# Patient Record
Sex: Male | Born: 1955 | Race: White | Hispanic: No | Marital: Married | Smoking: Former smoker
Health system: Southern US, Community
[De-identification: ages and names within clinical notes are randomized; demographics above are authoritative.]

## PROBLEM LIST (undated history)

## (undated) DIAGNOSIS — I10 Essential (primary) hypertension: Secondary | ICD-10-CM

## (undated) HISTORY — PX: BACK SURGERY: SHX140

## (undated) HISTORY — PX: JOINT REPLACEMENT: SHX530

---

## 2013-12-30 ENCOUNTER — Encounter (HOSPITAL_BASED_OUTPATIENT_CLINIC_OR_DEPARTMENT_OTHER): Payer: Self-pay | Admitting: Emergency Medicine

## 2013-12-30 ENCOUNTER — Emergency Department (HOSPITAL_BASED_OUTPATIENT_CLINIC_OR_DEPARTMENT_OTHER)
Admission: EM | Admit: 2013-12-30 | Discharge: 2013-12-30 | Disposition: A | Discharge status: Home / Self Care | Payer: BC Managed Care – PPO | Attending: Emergency Medicine | Admitting: Emergency Medicine

## 2013-12-30 ENCOUNTER — Emergency Department (HOSPITAL_BASED_OUTPATIENT_CLINIC_OR_DEPARTMENT_OTHER): Payer: BC Managed Care – PPO

## 2013-12-30 DIAGNOSIS — Z79899 Other long term (current) drug therapy: Secondary | ICD-10-CM | POA: Insufficient documentation

## 2013-12-30 DIAGNOSIS — Z23 Encounter for immunization: Secondary | ICD-10-CM | POA: Insufficient documentation

## 2013-12-30 DIAGNOSIS — W230XXA Caught, crushed, jammed, or pinched between moving objects, initial encounter: Secondary | ICD-10-CM | POA: Insufficient documentation

## 2013-12-30 DIAGNOSIS — S61209A Unspecified open wound of unspecified finger without damage to nail, initial encounter: Secondary | ICD-10-CM | POA: Insufficient documentation

## 2013-12-30 DIAGNOSIS — Y9389 Activity, other specified: Secondary | ICD-10-CM | POA: Insufficient documentation

## 2013-12-30 DIAGNOSIS — Z87891 Personal history of nicotine dependence: Secondary | ICD-10-CM | POA: Insufficient documentation

## 2013-12-30 DIAGNOSIS — I10 Essential (primary) hypertension: Secondary | ICD-10-CM | POA: Insufficient documentation

## 2013-12-30 DIAGNOSIS — S61219A Laceration without foreign body of unspecified finger without damage to nail, initial encounter: Secondary | ICD-10-CM

## 2013-12-30 DIAGNOSIS — Y929 Unspecified place or not applicable: Secondary | ICD-10-CM | POA: Insufficient documentation

## 2013-12-30 HISTORY — DX: Essential (primary) hypertension: I10

## 2013-12-30 MED ORDER — SULFAMETHOXAZOLE-TRIMETHOPRIM 800-160 MG PO TABS: 1.0000 | ORAL_TABLET | Freq: Two times a day (BID) | ORAL | Status: AC

## 2013-12-30 MED ORDER — HYDROCODONE-ACETAMINOPHEN 5-325 MG PO TABS: 2.0000 | ORAL_TABLET | ORAL | Status: AC | PRN

## 2013-12-30 MED ORDER — TETANUS-DIPHTH-ACELL PERTUSSIS 5-2.5-18.5 LF-MCG/0.5 IM SUSP
0.5000 mL | Freq: Once | INTRAMUSCULAR | Status: AC
  Administered 2013-12-30: 0.5 mL via INTRAMUSCULAR
  Filled 2013-12-30: qty 0.5

## 2013-12-30 NOTE — ED Notes (Signed)
MD at bedside. 

## 2013-12-30 NOTE — ED Provider Notes (Signed)
CSN: [id number]     Arrival date & time 12/30/13  1923 History   First MD Initiated Contact with Patient 12/30/13 2011     Chief Complaint  Patient presents with  . Extremity Laceration     (Consider location/radiation/quality/duration/timing/severity/associated sxs/prior Treatment) Patient is a 58 y.o. male presenting with hand pain. The history is provided by the patient. No language interpreter was used.  Hand Pain This is a new problem. The current episode started today. The problem occurs constantly. The problem has been gradually worsening. Nothing aggravates the symptoms. He has tried nothing for the symptoms. The treatment provided no relief.  Pt pinched tip of finger with a golf club case.    Past Medical History  Diagnosis Date  . Hypertension    Past Surgical History  Procedure Laterality Date  . Back surgery    . Joint replacement     History reviewed. No pertinent family history. History  Substance Use Topics  . Smoking status: Former Games developermoker  . Smokeless tobacco: Not on file  . Alcohol Use: No    Review of Systems  Skin: Positive for wound.  All other systems reviewed and are negative.     Allergies  Review of patient's allergies indicates no known allergies.  Home Medications   Prior to Admission medications   Medication Sig Start Date End Date Taking? Authorizing Provider  losartan (COZAAR) 25 MG tablet Take 25 mg by mouth daily.   Yes Historical Provider, MD  metoprolol succinate (TOPROL-XL) 50 MG 24 hr tablet Take 50 mg by mouth daily. Take with or immediately following a meal.   Yes Historical Provider, MD  HYDROcodone-acetaminophen (NORCO/VICODIN) 5-325 MG per tablet Take 2 tablets by mouth every 4 (four) hours as needed. 12/30/13   Elson AreasLeslie K Macyn Shropshire, PA-C  sulfamethoxazole-trimethoprim (BACTRIM DS,SEPTRA DS) 800-160 MG per tablet Take 1 tablet by mouth 2 (two) times daily. 12/30/13 01/06/14  Elson AreasLeslie K Tessie Ordaz, PA-C   BP 168/84  Pulse 64  Temp(Src) 97.8 F  (36.6 C) (Oral)  Resp 18  Ht 6' (1.829 m)  Wt 187 lb (84.823 kg)  BMI 25.36 kg/m2  SpO2 100% Physical Exam  Vitals reviewed. Constitutional: He appears well-developed and well-nourished.  HENT:  Head: Normocephalic.  Cardiovascular: Normal rate.   Pulmonary/Chest: Effort normal.  Musculoskeletal: He exhibits tenderness.  Laceration distal tip of left ring finger,   Flap has color, seems to have blood flow,  Ns intact  Neurological: He is alert.  Skin: Skin is warm.    ED Course  LACERATION REPAIR Date/Time: 12/30/2013 9:16 PM Performed by: Elson AreasSOFIA, Evva Din K Authorized by: Elson AreasSOFIA, Marisol Glazer K Consent: Verbal consent obtained. Risks and benefits: risks, benefits and alternatives were discussed Consent given by: patient Required items: required blood products, implants, devices, and special equipment available Patient identity confirmed: verbally with patient Body area: upper extremity Location details: left ring finger Laceration length: 1 cm Tendon involvement: none Nerve involvement: none Vascular damage: no Anesthesia: digital block Local anesthetic: lidocaine 2% without epinephrine Preparation: Patient was prepped and draped in the usual sterile fashion. Irrigation solution: saline Amount of cleaning: standard Debridement: none Skin closure: 5-0 Prolene Number of sutures: 4 Technique: simple Approximation: close Approximation difficulty: simple Patient tolerance: Patient tolerated the procedure well with no immediate complications.   (including critical care time) Labs Review Labs Reviewed - No data to display  Imaging Review Dg Finger Ring Left  12/30/2013   CLINICAL DATA:  Status post injury of the distal aspect of the  fourth finger  EXAM: LEFT RING FINGER 2+V  COMPARISON:  None.  FINDINGS: There is disruption of the soft tissues over the tip of the finger. The underlying bone is intact. The interphalangeal joints are intact. No foreign bodies are demonstrated.   IMPRESSION: The patient has sustained soft tissue injury of the distal phalanx of the fourth finger. No acute fracture is demonstrated.   Electronically Signed   By: David  [place]   On: 12/30/2013 19:59     EKG Interpretation None      MDM   Final diagnoses:  Laceration of finger    Bactrim hydrocodone    Elson Areas, PA-C 12/30/13 2117  Lonia Skinner [place], PA-C 12/30/13 2220  Lonia Skinner [place], PA-C 12/30/13 2221  Lonia Skinner [place], PA-C 12/30/13 2222  Lonia Skinner [place], PA-C 12/30/13 2223

## 2013-12-30 NOTE — ED Notes (Signed)
Pt c/o left ring finger laceration a 1 hr ago by metal suitcase

## 2013-12-30 NOTE — ED Notes (Signed)
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

## 2013-12-30 NOTE — ED Provider Notes (Signed)
Medical screening examination/treatment/procedure(s) were performed by non-physician practitioner and as supervising physician I was immediately available for consultation/collaboration.   EKG Interpretation None        Everitt Wenner, MD 12/30/13 2330 

## 2013-12-30 NOTE — Discharge Instructions (Signed)
Fingertip Injuries and Amputations °Fingertip injuries are common and often get injured because they are last to escape when pulling your hand out of harm's way. You have amputated (cut off) part of your finger. How this turns out depends largely on how much was amputated. If just the tip is amputated, often the end of the finger will grow back and the finger may return to much the same as it was before the injury.  °If more of the finger is missing, your caregiver has done the best with the tissue remaining to allow you to keep as much finger as is possible. Your caregiver after checking your injury has tried to leave you with a painless fingertip that has durable, feeling skin. If possible, your caregiver has tried to maintain the finger's length and appearance and preserve its fingernail.  °Please read the instructions outlined below and refer to this sheet in the next few weeks. These instructions provide you with general information on caring for yourself. Your caregiver may also give you specific instructions. While your treatment has been done according to the most current medical practices available, unavoidable complications occasionally occur. If you have any problems or questions after discharge, please call your caregiver. °HOME CARE INSTRUCTIONS  °· You may resume normal diet and activities as directed or allowed. °· Keep your hand elevated above the level of your heart. This helps decrease pain and swelling. °· Keep ice packs (or a bag of ice wrapped in a towel) on the injured area for 15-20 minutes, 03-04 times per day, for the first two days. °· Change dressings if necessary or as directed. °· Clean the wound daily or as directed. °· Only take over-the-counter or prescription medicines for pain, discomfort, or fever as directed by your caregiver. °· Keep appointments as directed. °SEEK IMMEDIATE MEDICAL CARE IF: °· You develop redness, swelling, numbness or increasing pain in the wound. °· There is  pus coming from the wound. °· You develop an unexplained oral temperature above 102° F (38.9° C) or as your caregiver suggests. °· There is a foul (bad) smell coming from the wound or dressing. °· There is a breaking open of the wound (edges not staying together) after sutures or staples have been removed. °MAKE SURE YOU:  °· Understand these instructions. °· Will watch your condition. °· Will get help right away if you are not doing well or get worse. °Document Released: 06/06/2005 Document Revised: 10/08/2011 Document Reviewed: 05/05/2008 °[employer]® Patient Information ©2014 [employer]. ° °

## 2015-07-13 IMAGING — CR DG FINGER RING 2+V*L*
3 series · 3 of 3 positions shown · non-contrast
Comparison: None.

CLINICAL DATA: Status post injury of the distal aspect of the
fourth finger

EXAM:
LEFT RING FINGER 2+V

[x finger pa left]
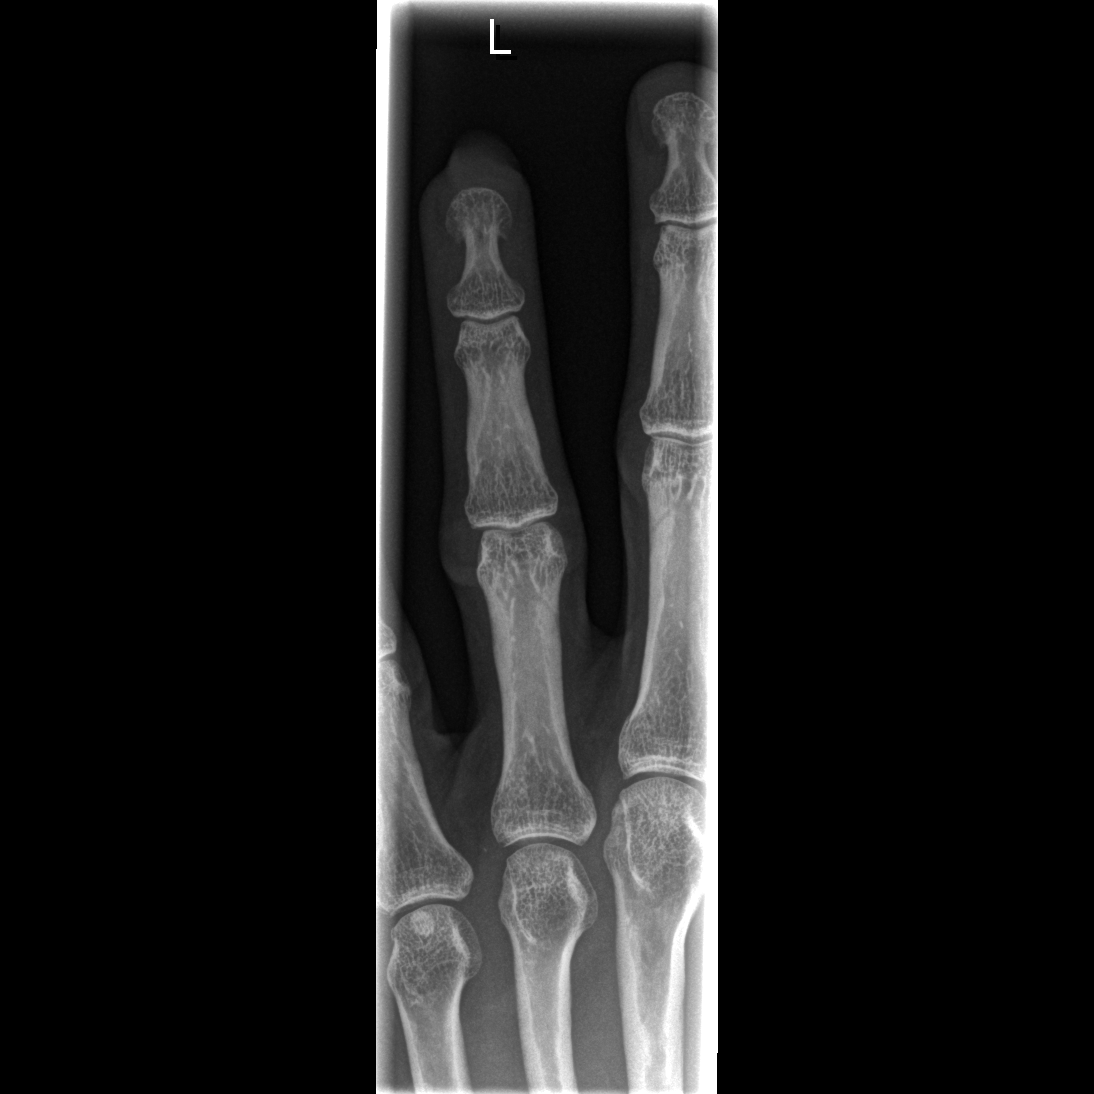

[x finger obl. left]
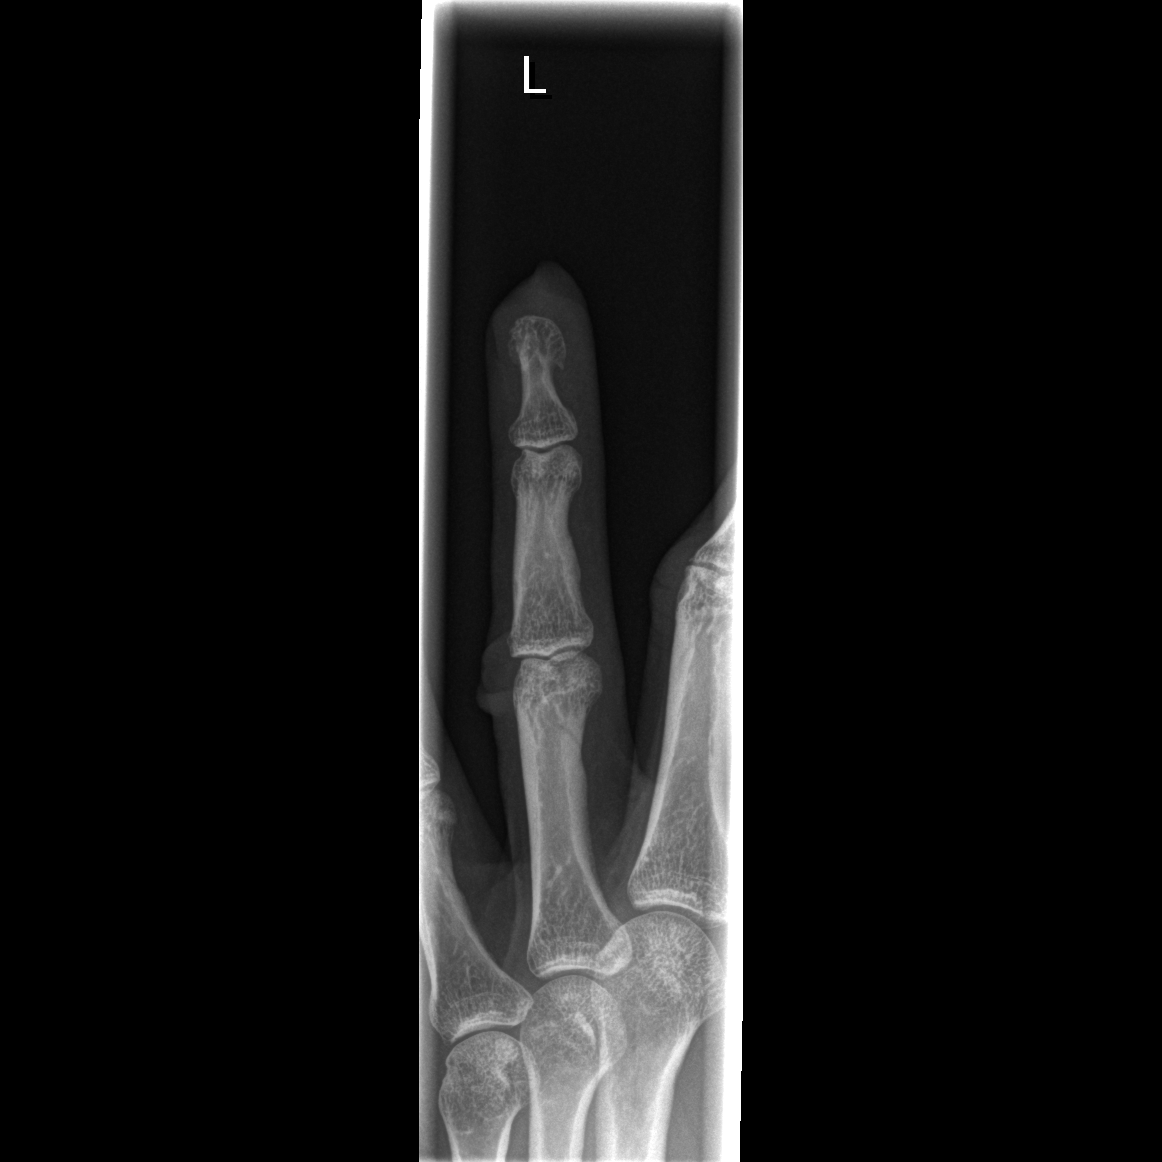

[x finger lateral left]
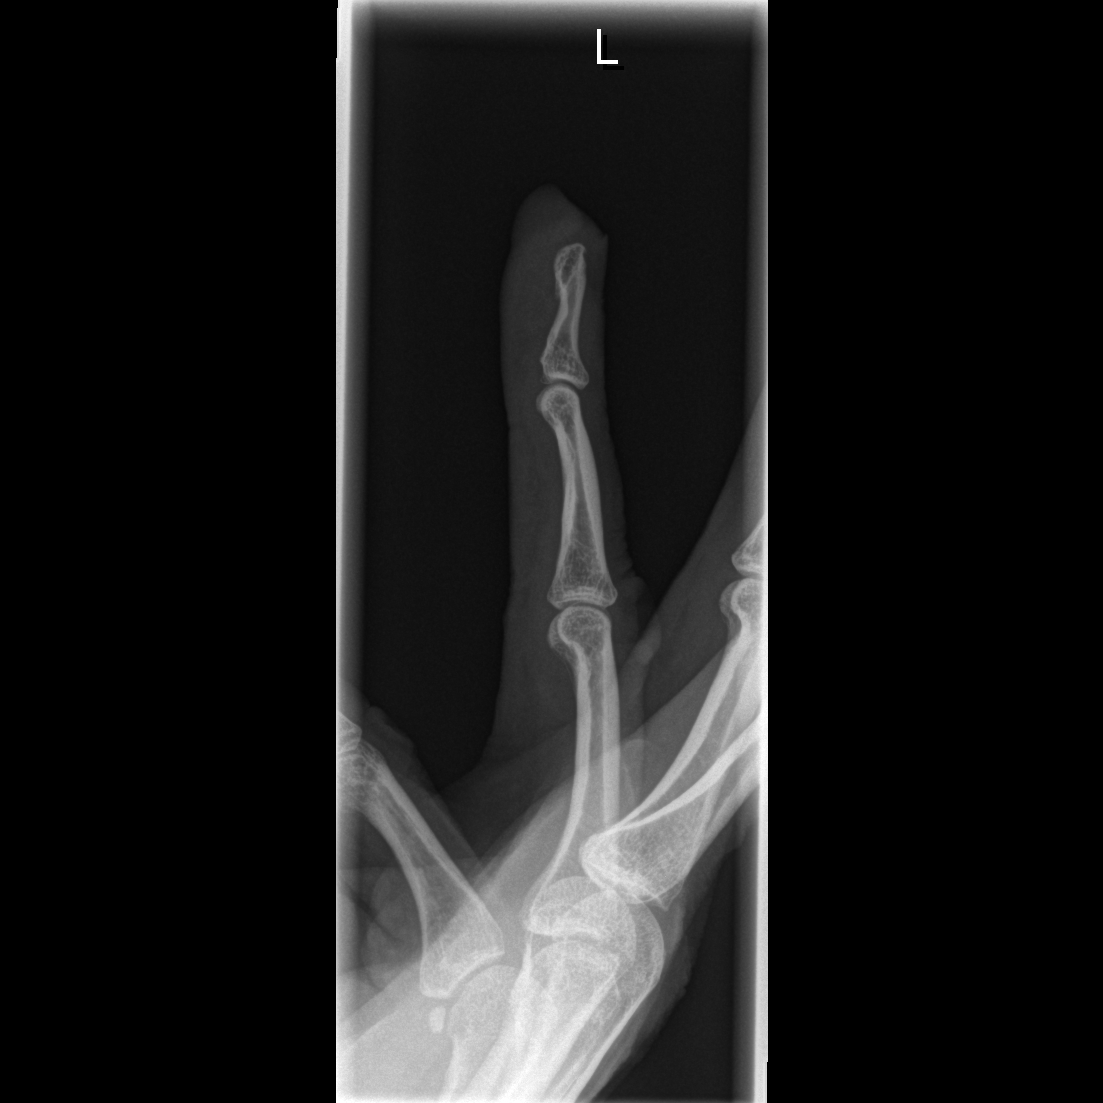

[3 of 3 positions shown; findings below may reference images not displayed]

FINDINGS: There is disruption of the soft tissues over the tip of the finger.
The underlying bone is intact. The interphalangeal joints are
intact. No foreign bodies are demonstrated.
IMPRESSION: The patient has sustained soft tissue injury of the distal phalanx
of the fourth finger. No acute fracture is demonstrated.
